# Patient Record
Sex: Male | Born: 1945 | Race: Black or African American | Hispanic: No | Marital: Married | Smoking: Current some day smoker
Health system: Southern US, Community
[De-identification: ages and names within clinical notes are randomized; demographics above are authoritative.]

---

## 2012-06-16 ENCOUNTER — Emergency Department (INDEPENDENT_AMBULATORY_CARE_PROVIDER_SITE_OTHER)
Admission: EM | Admit: 2012-06-16 | Discharge: 2012-06-16 | Disposition: A | Discharge status: Home / Self Care | Payer: Medicare Other | Source: Home / Self Care | Attending: Emergency Medicine | Admitting: Emergency Medicine

## 2012-06-16 ENCOUNTER — Emergency Department (INDEPENDENT_AMBULATORY_CARE_PROVIDER_SITE_OTHER): Payer: Medicare Other

## 2012-06-16 DIAGNOSIS — S4980XA Other specified injuries of shoulder and upper arm, unspecified arm, initial encounter: Secondary | ICD-10-CM

## 2012-06-16 DIAGNOSIS — S46909A Unspecified injury of unspecified muscle, fascia and tendon at shoulder and upper arm level, unspecified arm, initial encounter: Secondary | ICD-10-CM

## 2012-06-16 DIAGNOSIS — M25511 Pain in right shoulder: Secondary | ICD-10-CM

## 2012-06-16 DIAGNOSIS — S43004A Unspecified dislocation of right shoulder joint, initial encounter: Secondary | ICD-10-CM

## 2012-06-16 DIAGNOSIS — S43006A Unspecified dislocation of unspecified shoulder joint, initial encounter: Secondary | ICD-10-CM

## 2012-06-16 DIAGNOSIS — M25519 Pain in unspecified shoulder: Secondary | ICD-10-CM

## 2012-06-16 MED ORDER — TRAMADOL HCL 50 MG PO TABS: 50.0000 mg | ORAL_TABLET | Freq: Four times a day (QID) | ORAL | Status: AC | PRN
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
## 2014-07-24 IMAGING — CR DG SHOULDER 2+V*R*
3 series · 3 of 3 positions shown · non-contrast
Comparison: None.

CLINICAL DATA: Injury

RIGHT SHOULDER - 2+ VIEW

[view not recorded (1 of 3)]
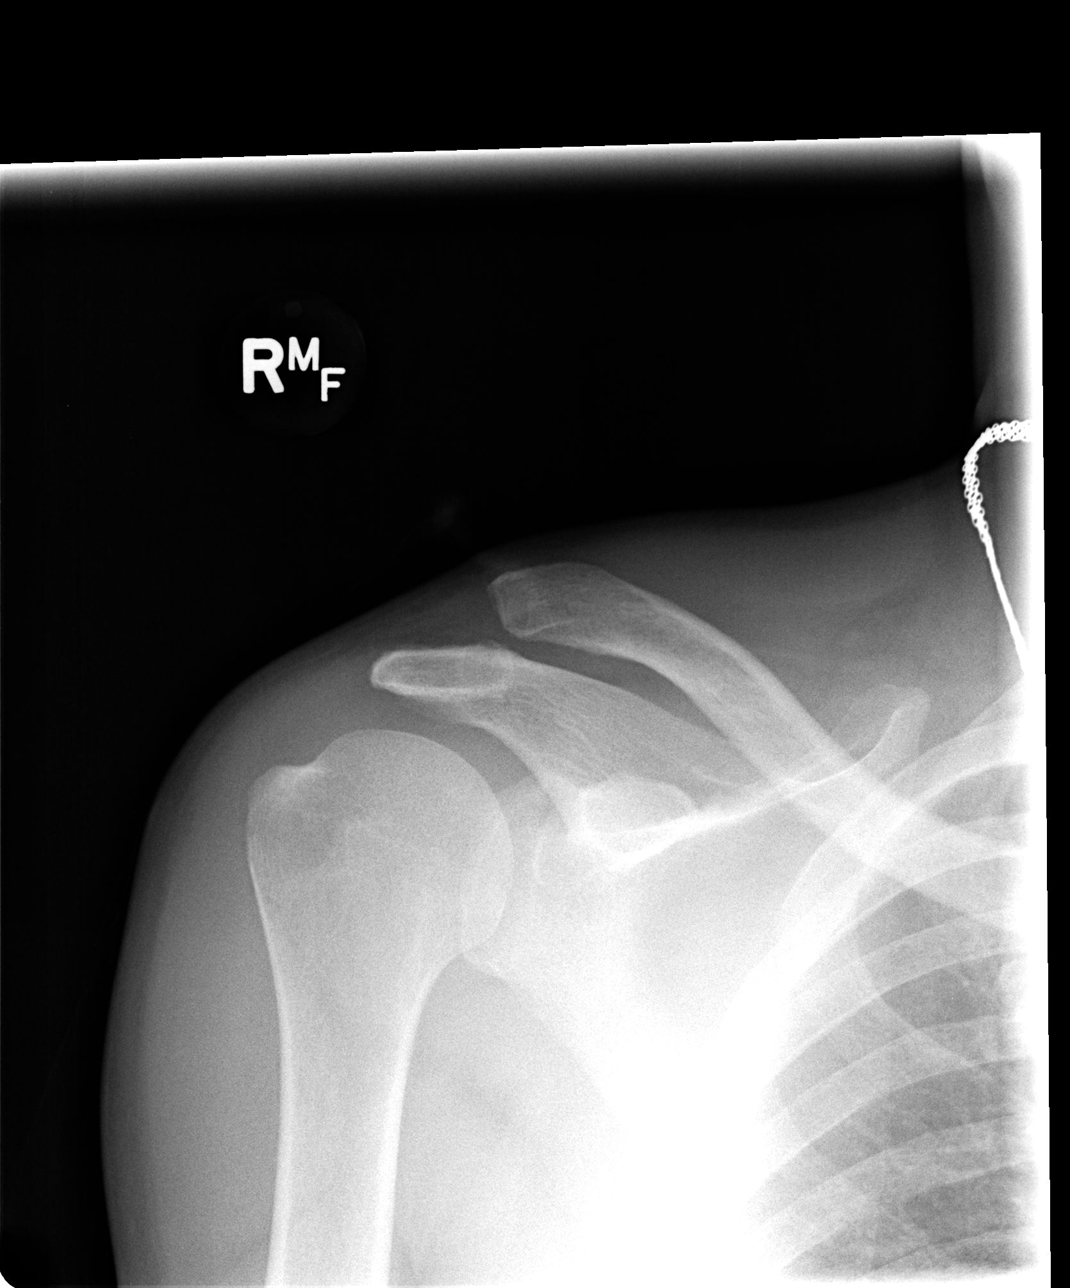

[view not recorded (2 of 3)]
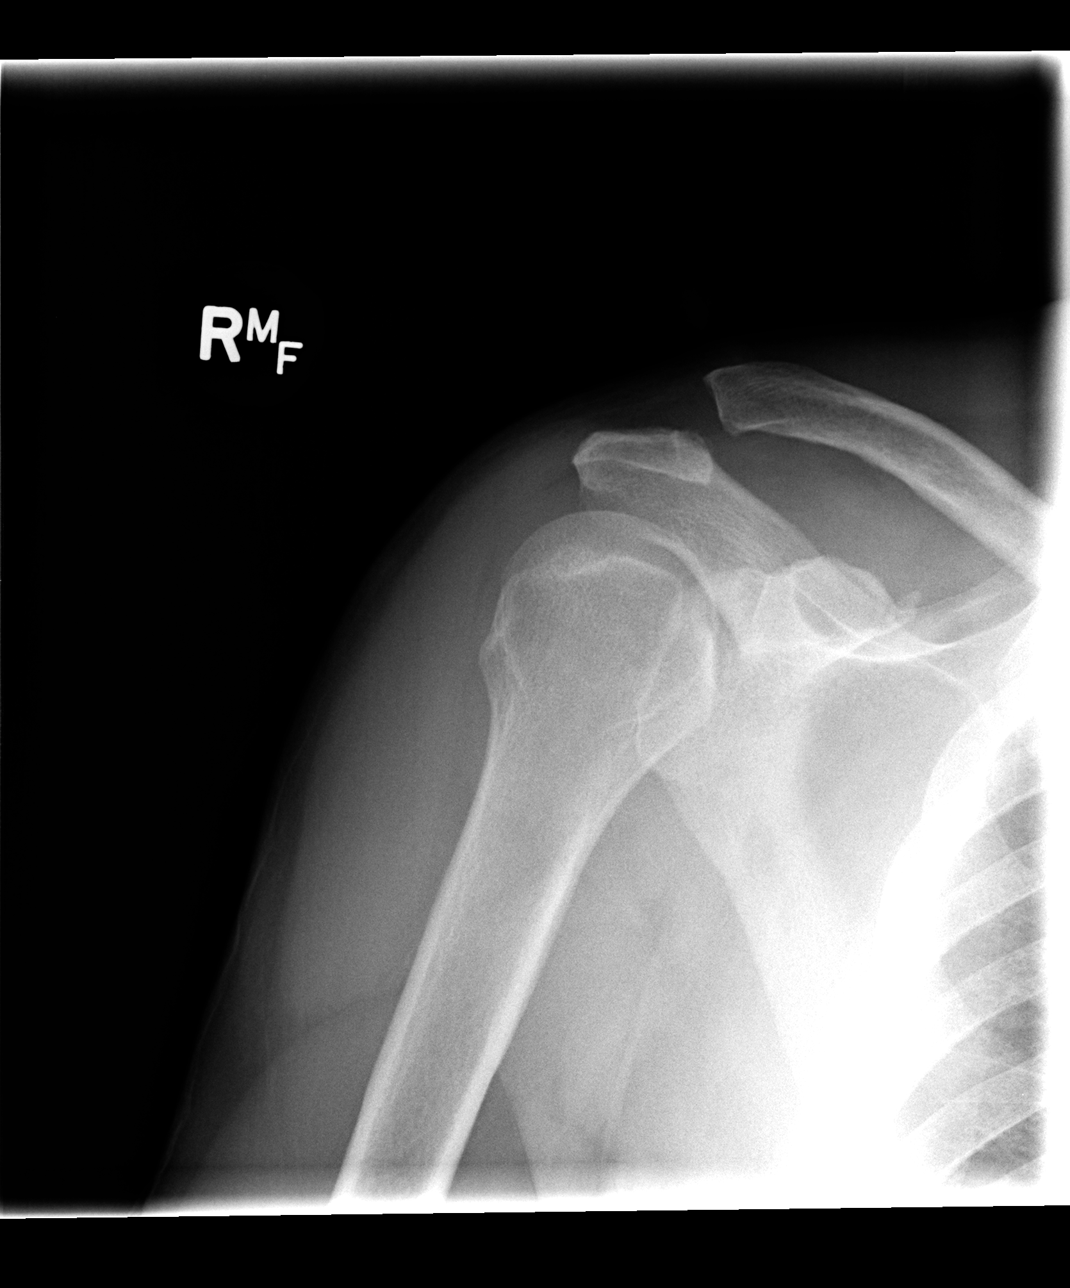

[view not recorded (3 of 3)]
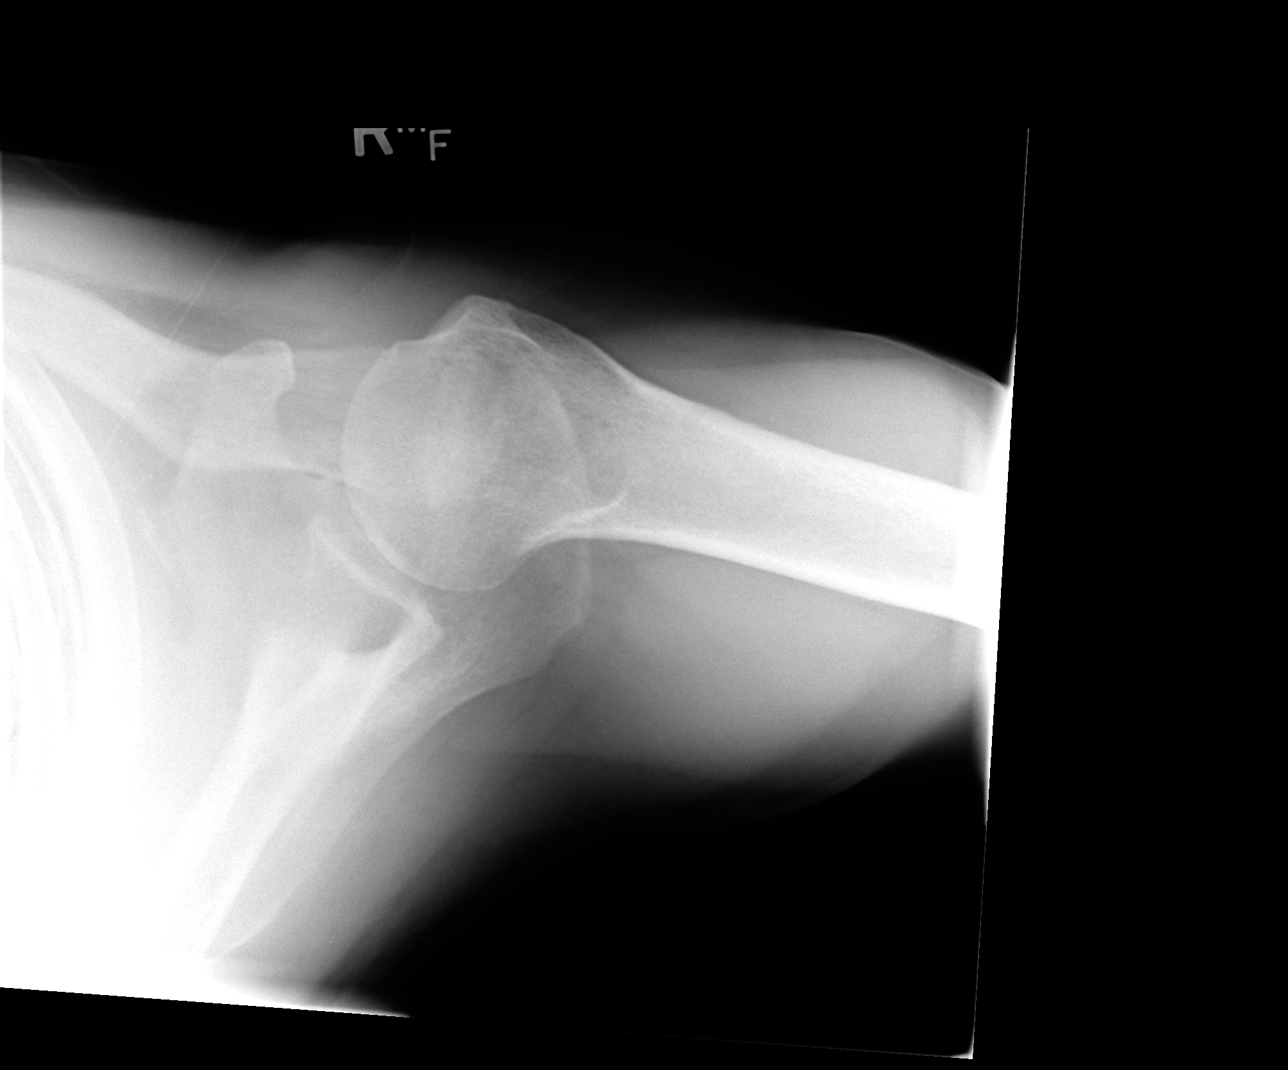

[3 of 3 positions shown; findings below may reference images not displayed]

FINDINGS: No acute fracture.  Anatomic alignment of the humeral
head with respect to the glenoid.

There is marked superior displacement of the peripheral clavicle
with respect to the AC joint.  The distance between the clavicle
and coracoid is markedly increased at 21 mm.  These findings
suggest tearing of the AC joint and coracoclavicular ligament.
IMPRESSION: Grade III AC joint and coracoclavicular ligament injury.

## 2014-09-02 ENCOUNTER — Encounter: Payer: Self-pay | Admitting: Podiatry

## 2014-09-02 ENCOUNTER — Ambulatory Visit (INDEPENDENT_AMBULATORY_CARE_PROVIDER_SITE_OTHER): Payer: Medicare Other | Admitting: Podiatry

## 2014-09-02 VITALS — BP 175/88 | HR 73 | Ht 71.5 in | Wt 170.0 lb

## 2014-09-02 DIAGNOSIS — M79605 Pain in left leg: Secondary | ICD-10-CM

## 2014-09-02 DIAGNOSIS — B070 Plantar wart: Secondary | ICD-10-CM | POA: Diagnosis not present

## 2014-09-02 DIAGNOSIS — M79673 Pain in unspecified foot: Secondary | ICD-10-CM

## 2014-09-02 DIAGNOSIS — M79606 Pain in leg, unspecified: Secondary | ICD-10-CM | POA: Insufficient documentation

## 2014-09-02 NOTE — Patient Instructions (Signed)
Seen for painful callus left foot. Debrided. Will do office procedure next week.  Place apply acid patch that are used for "Plantar's Wart" for the next 7 days before the next appointment.

## 2014-09-02 NOTE — Progress Notes (Signed)
Subjective: 69 y.o. year old male patient presents complaining of painful callus on left foot. Having difficulty walking from it.   Review of Systems - General ROS: negative.  Objective: Dermatologic: Normal nails. Thick deformed nail with enlarged distal phalangeal bone distal medial right 5th digit. Painful porokeratotic lesion under 2nd MPJ left foot with circular demarcation.  Vascular: Pedal pulses are all palpable. Orthopedic: Enlarged distal medial phalanx 5th digit right.  Neurologic: All epicritic and tactile sensations grossly intact.  Assessment: Painful porokeratotic lesion 2nd MPJ left, Possible plantar verruca.   Treatment: Painful area debrided. Instructed to place acid patch for one week and return. We will do office procedure to remove left foot lesion. Informed patient that recurrence rate is 50 %.

## 2014-09-11 ENCOUNTER — Ambulatory Visit (INDEPENDENT_AMBULATORY_CARE_PROVIDER_SITE_OTHER): Payer: Medicare Other | Admitting: Podiatry

## 2014-09-11 ENCOUNTER — Encounter: Payer: Self-pay | Admitting: Podiatry

## 2014-09-11 VITALS — BP 142/81 | HR 73 | Ht 71.5 in | Wt 170.0 lb

## 2014-09-11 DIAGNOSIS — B070 Plantar wart: Secondary | ICD-10-CM | POA: Diagnosis not present

## 2014-09-11 DIAGNOSIS — M79605 Pain in left leg: Secondary | ICD-10-CM | POA: Diagnosis not present

## 2014-09-11 NOTE — Patient Instructions (Signed)
Office procedure done. Excision plantar lesion. Stay off of feet for a day. Return in one week. Do daily dressing change after each shower.

## 2014-09-11 NOTE — Progress Notes (Signed)
Came in for office procedure porokeratotic lesion or plantar verruca under left ball.  Lesion removed through blunt and sharp dissection.  Compression bandage applied with home care instruction.  Post OP Dx. Plantar verruca 0.8cm. Procedure done: Excision lesion under ball left. Local anesthetics used: Area anesthetized with 3ml of 50/50 mixture, 0.5% marcaine plain and 1% Xylocaine with epinephrine.  Patient tolerated well.

## 2014-09-18 ENCOUNTER — Ambulatory Visit (INDEPENDENT_AMBULATORY_CARE_PROVIDER_SITE_OTHER): Payer: Medicare Other | Admitting: Podiatry

## 2014-09-18 ENCOUNTER — Encounter: Payer: Self-pay | Admitting: Podiatry

## 2014-09-18 DIAGNOSIS — B070 Plantar wart: Secondary | ICD-10-CM

## 2014-09-18 NOTE — Progress Notes (Signed)
Post op wound left foot healed well. Return when the area gets hard with scar, sub 2 left.

## 2014-09-18 NOTE — Patient Instructions (Signed)
Post op wound left foot clean, dry, and healing well. Return as needed.

## 2023-03-02 DEATH — deceased
